# Patient Record
Sex: Female | Born: 1970 | Race: White | Hispanic: No | Marital: Married | Smoking: Current every day smoker
Health system: Southern US, Community
[De-identification: ages and names within clinical notes are randomized; demographics above are authoritative.]

## PROBLEM LIST (undated history)

## (undated) DIAGNOSIS — R569 Unspecified convulsions: Secondary | ICD-10-CM

## (undated) DIAGNOSIS — G8929 Other chronic pain: Secondary | ICD-10-CM

## (undated) DIAGNOSIS — J449 Chronic obstructive pulmonary disease, unspecified: Secondary | ICD-10-CM

## (undated) DIAGNOSIS — J45909 Unspecified asthma, uncomplicated: Secondary | ICD-10-CM

## (undated) HISTORY — PX: CHOLECYSTECTOMY: SHX55

---

## 2019-11-23 ENCOUNTER — Emergency Department (HOSPITAL_COMMUNITY): Payer: Medicare Other

## 2019-11-23 ENCOUNTER — Emergency Department (HOSPITAL_COMMUNITY)
Admission: EM | Admit: 2019-11-23 | Discharge: 2019-11-23 | Disposition: A | Discharge status: Home / Self Care | Payer: Medicare Other | Attending: Emergency Medicine | Admitting: Emergency Medicine

## 2019-11-23 ENCOUNTER — Other Ambulatory Visit: Payer: Self-pay

## 2019-11-23 ENCOUNTER — Encounter (HOSPITAL_COMMUNITY): Payer: Self-pay | Admitting: *Deleted

## 2019-11-23 DIAGNOSIS — J441 Chronic obstructive pulmonary disease with (acute) exacerbation: Secondary | ICD-10-CM

## 2019-11-23 DIAGNOSIS — F17210 Nicotine dependence, cigarettes, uncomplicated: Secondary | ICD-10-CM | POA: Insufficient documentation

## 2019-11-23 DIAGNOSIS — R400 Somnolence: Secondary | ICD-10-CM

## 2019-11-23 DIAGNOSIS — Z9049 Acquired absence of other specified parts of digestive tract: Secondary | ICD-10-CM | POA: Diagnosis not present

## 2019-11-23 DIAGNOSIS — R05 Cough: Secondary | ICD-10-CM | POA: Diagnosis not present

## 2019-11-23 DIAGNOSIS — R042 Hemoptysis: Secondary | ICD-10-CM | POA: Insufficient documentation

## 2019-11-23 DIAGNOSIS — J45909 Unspecified asthma, uncomplicated: Secondary | ICD-10-CM | POA: Diagnosis not present

## 2019-11-23 DIAGNOSIS — R42 Dizziness and giddiness: Secondary | ICD-10-CM | POA: Diagnosis present

## 2019-11-23 DIAGNOSIS — Z20822 Contact with and (suspected) exposure to covid-19: Secondary | ICD-10-CM | POA: Diagnosis not present

## 2019-11-23 DIAGNOSIS — R0602 Shortness of breath: Secondary | ICD-10-CM | POA: Diagnosis not present

## 2019-11-23 HISTORY — DX: Unspecified asthma, uncomplicated: J45.909

## 2019-11-23 HISTORY — DX: Chronic obstructive pulmonary disease, unspecified: J44.9

## 2019-11-23 HISTORY — DX: Unspecified convulsions: R56.9

## 2019-11-23 HISTORY — DX: Other chronic pain: G89.29

## 2019-11-23 LAB — CBC WITH DIFFERENTIAL/PLATELET
Abs Immature Granulocytes: 0.03 10*3/uL (ref 0.00–0.07)
Basophils Absolute: 0.1 10*3/uL (ref 0.0–0.1)
Basophils Relative: 1 %
Eosinophils Absolute: 0.7 10*3/uL — ABNORMAL HIGH (ref 0.0–0.5)
Eosinophils Relative: 9 %
HCT: 39 % (ref 36.0–46.0)
Hemoglobin: 13 g/dL (ref 12.0–15.0)
Immature Granulocytes: 0 %
Lymphocytes Relative: 26 %
Lymphs Abs: 2.1 10*3/uL (ref 0.7–4.0)
MCH: 31 pg (ref 26.0–34.0)
MCHC: 33.3 g/dL (ref 30.0–36.0)
MCV: 93.1 fL (ref 80.0–100.0)
Monocytes Absolute: 0.5 10*3/uL (ref 0.1–1.0)
Monocytes Relative: 6 %
Neutro Abs: 4.8 10*3/uL (ref 1.7–7.7)
Neutrophils Relative %: 58 %
Platelets: 249 10*3/uL (ref 150–400)
RBC: 4.19 MIL/uL (ref 3.87–5.11)
RDW: 13.3 % (ref 11.5–15.5)
WBC: 8.3 10*3/uL (ref 4.0–10.5)
nRBC: 0 % (ref 0.0–0.2)

## 2019-11-23 LAB — COMPREHENSIVE METABOLIC PANEL
ALT: 27 U/L (ref 0–44)
AST: 36 U/L (ref 15–41)
Albumin: 3.8 g/dL (ref 3.5–5.0)
Alkaline Phosphatase: 99 U/L (ref 38–126)
Anion gap: 6 (ref 5–15)
BUN: 7 mg/dL (ref 6–20)
CO2: 31 mmol/L (ref 22–32)
Calcium: 8.8 mg/dL — ABNORMAL LOW (ref 8.9–10.3)
Chloride: 102 mmol/L (ref 98–111)
Creatinine, Ser: 0.46 mg/dL (ref 0.44–1.00)
GFR calc Af Amer: >60 mL/min (ref >60)
GFR calc non Af Amer: >60 mL/min (ref >60)
Glucose, Bld: 100 mg/dL — ABNORMAL HIGH (ref 70–99)
Potassium: 3.8 mmol/L (ref 3.5–5.1)
Sodium: 139 mmol/L (ref 135–145)
Total Bilirubin: 0.3 mg/dL (ref 0.3–1.2)
Total Protein: 7.2 g/dL (ref 6.5–8.1)

## 2019-11-23 LAB — URINALYSIS, ROUTINE W REFLEX MICROSCOPIC
Bacteria, UA: NONE SEEN
Bilirubin Urine: NEGATIVE
Glucose, UA: NEGATIVE mg/dL
Hgb urine dipstick: NEGATIVE
Ketones, ur: NEGATIVE mg/dL
Nitrite: NEGATIVE
Protein, ur: NEGATIVE mg/dL
Specific Gravity, Urine: 1.018 (ref 1.005–1.030)
pH: 6 (ref 5.0–8.0)

## 2019-11-23 LAB — BLOOD GAS, ARTERIAL
Acid-Base Excess: 5.7 mmol/L — ABNORMAL HIGH (ref 0.0–2.0)
Bicarbonate: 28.7 mmol/L — ABNORMAL HIGH (ref 20.0–28.0)
Drawn by: 22223
FIO2: 21
O2 Saturation: 94.8 %
Patient temperature: 37
pCO2 arterial: 53.1 mmHg — ABNORMAL HIGH (ref 32.0–48.0)
pH, Arterial: 7.38 (ref 7.350–7.450)
pO2, Arterial: 75.1 mmHg — ABNORMAL LOW (ref 83.0–108.0)

## 2019-11-23 LAB — RESPIRATORY PANEL BY RT PCR (FLU A&B, COVID)
Influenza A by PCR: NEGATIVE
Influenza B by PCR: NEGATIVE
SARS Coronavirus 2 by RT PCR: NEGATIVE

## 2019-11-23 LAB — RAPID URINE DRUG SCREEN, HOSP PERFORMED
Amphetamines: NOT DETECTED
Barbiturates: NOT DETECTED
Benzodiazepines: POSITIVE — AB
Cocaine: NOT DETECTED
Opiates: NOT DETECTED
Tetrahydrocannabinol: NOT DETECTED

## 2019-11-23 LAB — POC SARS CORONAVIRUS 2 AG -  ED: SARS Coronavirus 2 Ag: NEGATIVE

## 2019-11-23 LAB — ETHANOL: Alcohol, Ethyl (B): <10 mg/dL (ref <10)

## 2019-11-23 MED ORDER — METHYLPREDNISOLONE SODIUM SUCC 125 MG IJ SOLR
125.0000 mg | Freq: Once | INTRAMUSCULAR | Status: AC
  Administered 2019-11-23: 125 mg via INTRAVENOUS
  Filled 2019-11-23: qty 2

## 2019-11-23 MED ORDER — IPRATROPIUM-ALBUTEROL 0.5-2.5 (3) MG/3ML IN SOLN
3.0000 mL | Freq: Once | RESPIRATORY_TRACT | Status: AC
  Administered 2019-11-23: 3 mL via RESPIRATORY_TRACT
  Filled 2019-11-23: qty 3

## 2019-11-23 MED ORDER — DOXYCYCLINE HYCLATE 100 MG PO CAPS: 100.0000 mg | ORAL_CAPSULE | Freq: Two times a day (BID) | ORAL | 0 refills | Status: AC

## 2019-11-23 MED ORDER — IPRATROPIUM BROMIDE 0.02 % IN SOLN: 0.5000 mg | Freq: Once | RESPIRATORY_TRACT | Status: DC

## 2019-11-23 MED ORDER — ALBUTEROL SULFATE HFA 108 (90 BASE) MCG/ACT IN AERS
4.0000 | INHALATION_SPRAY | Freq: Once | RESPIRATORY_TRACT | Status: AC
  Administered 2019-11-23: 20:00:00 4 via RESPIRATORY_TRACT
  Filled 2019-11-23: qty 6.7

## 2019-11-23 MED ORDER — ALBUTEROL SULFATE (2.5 MG/3ML) 0.083% IN NEBU
2.5000 mg | INHALATION_SOLUTION | Freq: Once | RESPIRATORY_TRACT | Status: AC
  Administered 2019-11-23: 23:00:00 2.5 mg via RESPIRATORY_TRACT
  Filled 2019-11-23: qty 3

## 2019-11-23 MED ORDER — ALBUTEROL SULFATE (2.5 MG/3ML) 0.083% IN NEBU: 5.0000 mg | INHALATION_SOLUTION | Freq: Once | RESPIRATORY_TRACT | Status: DC

## 2019-11-23 MED ORDER — NALOXONE HCL 0.4 MG/ML IJ SOLN
0.4000 mg | Freq: Once | INTRAMUSCULAR | Status: AC
  Administered 2019-11-23: 0.4 mg via INTRAVENOUS
  Filled 2019-11-23: qty 1

## 2019-11-23 MED ORDER — ALBUTEROL SULFATE HFA 108 (90 BASE) MCG/ACT IN AERS: 2.0000 | INHALATION_SPRAY | RESPIRATORY_TRACT | 0 refills | Status: AC | PRN

## 2019-11-23 MED ORDER — PREDNISONE 10 MG (21) PO TBPK: ORAL_TABLET | ORAL | 0 refills | Status: AC

## 2019-11-23 NOTE — ED Triage Notes (Signed)
Pt c/o dizziness since yesterday after coughing up some blood, states she had a blood clot in her mouth.  When called for triage, pt was starting to nod off to sleep. Pt denies taking anything, states she is tired from not getting enough sleep.

## 2019-11-23 NOTE — ED Notes (Signed)
Placed pt.on 2L of O2 nasal cannula. When sleeping Pt. Drops down to the 80's and when woken up will come back up to the 90's.

## 2019-11-23 NOTE — ED Provider Notes (Signed)
[facility] EMERGENCY DEPARTMENT Provider Note  CSN: [id number] Arrival date & time: 11/23/19 1829    History Chief Complaint  Patient presents with  . Dizziness    HPI  Tina Montoya is a 49 y.o. female presents for evaluation of dizziness and hemoptysis.  Patient reports history of COPD although she has not been to the doctor recently does not take any medications on a regular basis.  She reports that she has had a worsening cough and shortness of breath for the last several days.  She denies any fever or chest pain but reports she noticed some blood in her sputum yesterday.  Today she feels some mild dizziness but no falls.  She was noted to be somnolent by nursing but denies any alcohol or drug use.  She does admit to significant tobacco use, at least 2 packs/day.  She has previously had hospital visits for pneumonia although none in this area.  She also previously had an inhaler but has since run out.   Past Medical History:  Diagnosis Date  . Asthma   . Chronic back pain   . COPD (chronic obstructive pulmonary disease) (HCC)   . Seizures (HCC)     Past Surgical History:  Procedure Laterality Date  . CHOLECYSTECTOMY      History reviewed. No pertinent family history.  Social History   Tobacco Use  . Smoking status: Current Every Day Smoker    Types: Cigarettes  . Smokeless tobacco: Never Used  Substance Use Topics  . Alcohol use: Not Currently  . Drug use: Not Currently     Home Medications Prior to Admission medications   Medication Sig Start Date End Date Taking? Authorizing Provider  albuterol (VENTOLIN HFA) 108 (90 Base) MCG/ACT inhaler Inhale 2 puffs into the lungs every 4 (four) hours as needed for wheezing or shortness of breath. 11/23/19   Pollyann Savoy, MD  doxycycline (VIBRAMYCIN) 100 MG capsule Take 1 capsule (100 mg total) by mouth 2 (two) times daily. 11/23/19   Pollyann Savoy, MD  predniSONE (STERAPRED UNI-PAK 21 TAB) 10 MG (21) TBPK  tablet Use as directed, 10mg  tablets, 6 day taper 11/23/19   01/23/20, MD     Allergies    Penicillins   Review of Systems   Review of Systems  Constitutional: Negative for fever.  HENT: Negative for congestion and sore throat.   Respiratory: Positive for cough and shortness of breath.   Cardiovascular: Positive for leg swelling. Negative for chest pain.  Gastrointestinal: Negative for abdominal pain, diarrhea, nausea and vomiting.  Genitourinary: Negative for dysuria.  Musculoskeletal: Negative for myalgias.  Skin: Negative for rash.  Neurological: Positive for dizziness. Negative for headaches.  Psychiatric/Behavioral: Negative for behavioral problems.     Physical Exam Pulse 93   Resp (!) 24   Ht 5\' 4"  (1.626 m)   Wt 85.3 kg   SpO2 96%   BMI 32.27 kg/m   Physical Exam Constitutional:      Appearance: Normal appearance.     Comments: Sleepy but arouses to verbal stimuli and is able to stay awake during HPI  HENT:     Head: Normocephalic and atraumatic.     Nose: Nose normal.     Mouth/Throat:     Mouth: Mucous membranes are moist.  Eyes:     Extraocular Movements: Extraocular movements intact.     Conjunctiva/sclera: Conjunctivae normal.  Cardiovascular:     Rate and Rhythm: Normal rate.  Pulmonary:  Comments: Diminished breath sounds, decreased air movement and wheezing in all lung fields Abdominal:     General: Abdomen is flat.     Palpations: Abdomen is soft.     Tenderness: There is no abdominal tenderness.  Musculoskeletal:        General: Swelling (Trace bilateral lower extremity edema, symmetric) present. Normal range of motion.     Cervical back: Neck supple.  Skin:    General: Skin is warm and dry.  Neurological:     General: No focal deficit present.  Psychiatric:        Mood and Affect: Mood normal.      ED Results / Procedures / Treatments   Labs (all labs ordered are listed, but only abnormal results are displayed) Labs  Reviewed  COMPREHENSIVE METABOLIC PANEL - Abnormal; Notable for the following components:      Result Value   Glucose, Bld 100 (*)    Calcium 8.8 (*)    All other components within normal limits  CBC WITH DIFFERENTIAL/PLATELET - Abnormal; Notable for the following components:   Eosinophils Absolute 0.7 (*)    All other components within normal limits  URINALYSIS, ROUTINE W REFLEX MICROSCOPIC - Abnormal; Notable for the following components:   Leukocytes,Ua TRACE (*)    All other components within normal limits  RAPID URINE DRUG SCREEN, HOSP PERFORMED - Abnormal; Notable for the following components:   Benzodiazepines POSITIVE (*)    All other components within normal limits  BLOOD GAS, ARTERIAL - Abnormal; Notable for the following components:   pCO2 arterial 53.1 (*)    pO2, Arterial 75.1 (*)    Bicarbonate 28.7 (*)    Acid-Base Excess 5.7 (*)    All other components within normal limits  RESPIRATORY PANEL BY RT PCR (FLU A&B, COVID)  ETHANOL  POC SARS CORONAVIRUS 2 AG -  ED    EKG EKG Interpretation  Date/Time:  Thursday November 23 2019 18:46:08 EDT Ventricular Rate:  97 PR Interval:    QRS Duration: 83 QT Interval:  352 QTC Calculation: 448 R Axis:   51 Text Interpretation: Sinus rhythm Normal ECG No old tracing to compare Confirmed by [facility]  MD, Tyasia Packard [id number]) on 11/23/2019 6:59:52 PM   Radiology DG Chest Portable 1 View  Result Date: 11/23/2019 CLINICAL DATA:  Cough. EXAM: PORTABLE CHEST 1 VIEW COMPARISON:  None. FINDINGS: There are hazy lung markings in the left lung field without evidence for large focal infiltrate. There is no pleural effusion. The heart size is normal. There is no pneumothorax. There is no acute osseous abnormality. IMPRESSION: No definite acute cardiopulmonary process. There are few subtle linear opacities in the left mid lung zone which may represent areas of atelectasis or scarring. Electronically Signed   By: Katherine Mantle M.D.   On:  11/23/2019 19:41    Procedures Procedures  Medications Ordered in the ED Medications  albuterol (VENTOLIN HFA) 108 (90 Base) MCG/ACT inhaler 4 puff (4 puffs Inhalation Given 11/23/19 1946)  methylPREDNISolone sodium succinate (SOLU-MEDROL) 125 mg/2 mL injection 125 mg (125 mg Intravenous Given 11/23/19 1947)  naloxone [facility]) injection 0.4 mg (0.4 mg Intravenous Given 11/23/19 2109)  ipratropium-albuterol (DUONEB) 0.5-2.5 (3) MG/3ML nebulizer solution 3 mL (3 mLs Nebulization Given 11/23/19 2251)  albuterol (PROVENTIL) (2.5 MG/3ML) 0.083% nebulizer solution 2.5 mg (2.5 mg Nebulization Given 11/23/19 2251)     ED Course  I have reviewed the triage vital signs and the nursing notes.  Pertinent labs & imaging results that were available  during my care of the patient were reviewed by me and considered in my medical decision making (see chart for details).  Clinical Course as of Nov 23 2319  Thu Nov 23, 2019  7001 Patient with reported history of COPD, not taking any medications.  Noted to be sleeping with oxygen sats in the upper 80s/low 90s.  Nursing was asked to place her on 2 L nasal cannula, goal is SPO2 of 92%.   [CS]  1947 ABG does not show a significant hypercapnia or respiratory acidosis.    [CS]  2111 Patient continues to have somnolence with brief periods of apnea and hypoxia. CO2 not significantly elevated, SpO2 improves with waking. Will give a small dose of Narcan to see if that helps.    [CS]  2129 Patient's Covid POC is negative. Will send a 2-hour TAT test to help clear for nebulizer treatments.    [CS]  2228 Covid PRC is neg. Albuterol neb ordered. Patient is reluctant to stay in the hospital. CAnnot explain why she is so sleepy except to say she hasn't been sleeping well at home recently.    [CS]  2316 Patient feeling better after neb treatment. Her UDS shows benzos but she is adamant that she has not been taking anything. This is likely the cause of her somnolence. I discussed  admission for further treatment of her likely COPD exacerbation but she adamant that she wants to go home. She will be given Rx for steroids, inhaler and advised to return to the ED if her symptoms worsen. Encouraged to stop smoking.    [CS]    Clinical Course User Index [CS] Truddie Hidden, MD    MDM Rules/Calculators/A&P MDM Number of Diagnoses or Management Options Diagnosis management comments: Patient with shortness of breath and reported hemoptysis, sputum in the ED is thick white/yellow.  She is hypersomnolent but awakens easily.  She denies any alcohol or drug use.  Differential diagnosis includes illicit substance use, hypercapnia, pneumonia, hypoxia.  Labs and chest x-ray were ordered to evaluate the above conditions.    Amount and/or Complexity of Data Reviewed Clinical lab tests: ordered and reviewed Tests in the radiology section of CPT: ordered and reviewed Review and summarize past medical records: yes Independent visualization of images, tracings, or specimens: yes  Risk of Complications, Morbidity, and/or Mortality Presenting problems: high Diagnostic procedures: high Management options: high    Final Clinical Impression(s) / ED Diagnoses Final diagnoses:  COPD with acute exacerbation ([place])  Hemoptysis  Somnolence    Rx / DC Orders ED Discharge Orders         Ordered    albuterol (VENTOLIN HFA) 108 (90 Base) MCG/ACT inhaler  Every 4 hours PRN     11/23/19 2321    predniSONE (STERAPRED UNI-PAK 21 TAB) 10 MG (21) TBPK tablet     11/23/19 2321    doxycycline (VIBRAMYCIN) 100 MG capsule  2 times daily     11/23/19 2321           Truddie Hidden, MD 11/23/19 2321

## 2019-11-27 ENCOUNTER — Ambulatory Visit
Admission: EM | Admit: 2019-11-27 | Discharge: 2019-11-27 | Disposition: A | Discharge status: Home / Self Care | Payer: Medicare Other | Attending: Family Medicine | Admitting: Family Medicine

## 2019-11-27 ENCOUNTER — Other Ambulatory Visit: Payer: Self-pay

## 2019-11-27 DIAGNOSIS — J449 Chronic obstructive pulmonary disease, unspecified: Secondary | ICD-10-CM | POA: Diagnosis not present

## 2019-11-27 DIAGNOSIS — R0602 Shortness of breath: Secondary | ICD-10-CM

## 2019-11-27 DIAGNOSIS — J441 Chronic obstructive pulmonary disease with (acute) exacerbation: Secondary | ICD-10-CM

## 2019-11-27 MED ORDER — BENZONATATE 100 MG PO CAPS: 100.0000 mg | ORAL_CAPSULE | Freq: Three times a day (TID) | ORAL | 0 refills | Status: AC | PRN

## 2019-11-27 MED ORDER — HYDROCODONE-HOMATROPINE 5-1.5 MG/5ML PO SYRP: 5.0000 mL | ORAL_SOLUTION | Freq: Every evening | ORAL | 0 refills | Status: AC | PRN

## 2019-11-27 MED ORDER — ALBUTEROL SULFATE (2.5 MG/3ML) 0.083% IN NEBU: 2.5000 mg | INHALATION_SOLUTION | Freq: Four times a day (QID) | RESPIRATORY_TRACT | 12 refills | Status: AC | PRN

## 2019-11-27 MED ORDER — IPRATROPIUM BROMIDE 0.02 % IN SOLN: 0.5000 mg | Freq: Four times a day (QID) | RESPIRATORY_TRACT | 12 refills | Status: DC

## 2019-11-27 MED ORDER — IPRATROPIUM BROMIDE 0.02 % IN SOLN: 0.5000 mg | Freq: Four times a day (QID) | RESPIRATORY_TRACT | 12 refills | Status: AC | PRN

## 2019-11-27 NOTE — ED Provider Notes (Signed)
[facility]    CSN: [id number] Arrival date & time: 11/27/19  1634      History   Chief Complaint No chief complaint on file.   HPI Tina Montoya is a 49 y.o. female.   HPI   Patient presents for further evaluation of cough and shortness of breath related to COPD exacerbation. Patient initially evaluated at the ER 11/23/19, however, is here today requesting a nebulizer treatment. Due to COVID-19 nebulizer treatments are not being administered system wide. She continues cough with productive discolored sputum. Sputum occasionally has blood mixed sputum. She endorses chest tightness. She has an albuterol inhaler which is not providing adequate relief. She is afebrile. Denies any recent exposures to COVID-19. Recent CXR negative for any acute changes. Past Medical History:  Diagnosis Date  . Asthma   . Chronic back pain   . COPD (chronic obstructive pulmonary disease) ([place])   . Seizures ([place])     There are no problems to display for this patient.   Past Surgical History:  Procedure Laterality Date  . CHOLECYSTECTOMY      OB History   No obstetric history on file.      Home Medications    Prior to Admission medications   Medication Sig Start Date End Date Taking? Authorizing Provider  albuterol (PROVENTIL) (2.5 MG/3ML) 0.083% nebulizer solution Take 3 mLs (2.5 mg total) by nebulization every 6 (six) hours as needed for wheezing or shortness of breath. 11/27/19   Scot Jun, FNP  albuterol (VENTOLIN HFA) 108 (90 Base) MCG/ACT inhaler Inhale 2 puffs into the lungs every 4 (four) hours as needed for wheezing or shortness of breath. 11/23/19   Truddie Hidden, MD  benzonatate (TESSALON) 100 MG capsule Take 1-2 capsules (100-200 mg total) by mouth 3 (three) times daily as needed for cough. 11/27/19   Scot Jun, FNP  doxycycline (VIBRAMYCIN) 100 MG capsule Take 1 capsule (100 mg total) by mouth 2 (two) times daily. 11/23/19   Truddie Hidden, MD    HYDROcodone-homatropine [facility]) 5-1.5 MG/5ML syrup Take 5 mLs by mouth at bedtime as needed and may repeat dose one time if needed for cough. 11/27/19   Scot Jun, FNP  ipratropium (ATROVENT) 0.02 % nebulizer solution Take 2.5 mLs (0.5 mg total) by nebulization every 6 (six) hours as needed for wheezing or shortness of breath. 11/27/19   Scot Jun, FNP  predniSONE (STERAPRED UNI-PAK 21 TAB) 10 MG (21) TBPK tablet Use as directed, 10mg  tablets, 6 day taper 11/23/19   Truddie Hidden, MD    Family History History reviewed. No pertinent family history.  Social History Social History   Tobacco Use  . Smoking status: Current Every Day Smoker    Types: Cigarettes  . Smokeless tobacco: Never Used  Substance Use Topics  . Alcohol use: Not Currently  . Drug use: Not Currently     Allergies   Penicillins   Review of Systems Review of Systems Pertinent negatives listed in HPI Physical Exam Triage Vital Signs ED Triage Vitals  Enc Vitals Group     BP 11/27/19 1641 (!) 163/90     Pulse Rate 11/27/19 1641 93     Resp 11/27/19 1641 (!) 22     Temp 11/27/19 1641 98.8 F (37.1 C)     Temp Source 11/27/19 1641 Oral     SpO2 11/27/19 1641 91 %     Weight --      Height --  Head Circumference --      Peak Flow --      Pain Score 11/27/19 1644 0     Pain Loc --      Pain Edu? --      Excl. in GC? --    No data found.  Updated Vital Signs BP (!) 163/90 (BP Location: Right Arm)   Pulse 93   Temp 98.8 F (37.1 C) (Oral)   Resp (!) 22   SpO2 91%   Visual Acuity Right Eye Distance:   Left Eye Distance:   Bilateral Distance:    Right Eye Near:   Left Eye Near:    Bilateral Near:     Physical Exam Constitutional:      Appearance: She is ill-appearing.  HENT:     Nose: Congestion present.     Mouth/Throat:     Pharynx: Posterior oropharyngeal erythema present. No oropharyngeal exudate.  Cardiovascular:     Rate and Rhythm: Normal rate and regular  rhythm.  Pulmonary:     Effort: No respiratory distress.     Breath sounds: No stridor. Rhonchi present. No wheezing or rales.  Musculoskeletal:     Cervical back: Normal range of motion.  Lymphadenopathy:     Cervical: No cervical adenopathy.  Skin:    General: Skin is warm.  Neurological:     General: No focal deficit present.     Mental Status: She is alert.  Psychiatric:        Mood and Affect: Mood normal.      UC Treatments / Results  Labs (all labs ordered are listed, but only abnormal results are displayed) Labs Reviewed - No data to display  EKG   Radiology No results found.  Procedures Procedures (including critical care time)  Medications Ordered in UC Medications - No data to display  Initial Impression / Assessment and Plan / UC Course  I have reviewed the triage vital signs and the nursing notes.  Pertinent labs & imaging results that were available during my care of the patient were reviewed by me and considered in my medical decision making (see chart for details).    COPD exacerbation (HCC) SOB (shortness of breath) -Continue Doxycyline and prednisone prescribed during ER visit. -Start nebulizer treatments with albuterol and Atrovent every 6 hours as needed for wheezing. -Start benzonatate 100-200 mg 3 times daily as needed for cough. -Hycodan 5 ml at bedtime only as needed for cough.  Final Clinical Impressions(s) / UC Diagnosis    Final diagnoses:  COPD exacerbation (HCC)  SOB (shortness of breath)   Discharge Instructions   None    ED Prescriptions    Medication Sig Dispense Auth. Provider   albuterol (PROVENTIL) (2.5 MG/3ML) 0.083% nebulizer solution Take 3 mLs (2.5 mg total) by nebulization every 6 (six) hours as needed for wheezing or shortness of breath. 75 mL Bing Neighbors, FNP   ipratropium (ATROVENT) 0.02 % nebulizer solution  (Status: Discontinued) Take 2.5 mLs (0.5 mg total) by nebulization 4 (four) times daily. 75 mL  Bing Neighbors, FNP   HYDROcodone-homatropine [facility]) 5-1.5 MG/5ML syrup Take 5 mLs by mouth at bedtime as needed and may repeat dose one time if needed for cough. 100 mL Bing Neighbors, FNP   benzonatate (TESSALON) 100 MG capsule Take 1-2 capsules (100-200 mg total) by mouth 3 (three) times daily as needed for cough. 60 capsule Bing Neighbors, FNP   ipratropium (ATROVENT) 0.02 % nebulizer solution Take 2.5 mLs (0.5  mg total) by nebulization every 6 (six) hours as needed for wheezing or shortness of breath. 75 mL Bing Neighbors, FNP     I have reviewed the PDMP during this encounter.   Bing Neighbors, FNP 11/29/19 1353

## 2019-11-27 NOTE — ED Triage Notes (Signed)
Pt presents with c/o cough and blood in sputum was seen in ED on the 8th

## 2019-12-27 DIAGNOSIS — J449 Chronic obstructive pulmonary disease, unspecified: Secondary | ICD-10-CM | POA: Diagnosis not present

## 2020-01-11 DIAGNOSIS — M545 Low back pain: Secondary | ICD-10-CM | POA: Diagnosis not present

## 2020-01-11 DIAGNOSIS — E559 Vitamin D deficiency, unspecified: Secondary | ICD-10-CM | POA: Diagnosis not present

## 2020-01-11 DIAGNOSIS — E785 Hyperlipidemia, unspecified: Secondary | ICD-10-CM | POA: Diagnosis not present

## 2020-01-11 DIAGNOSIS — R030 Elevated blood-pressure reading, without diagnosis of hypertension: Secondary | ICD-10-CM | POA: Diagnosis not present

## 2020-01-11 DIAGNOSIS — J449 Chronic obstructive pulmonary disease, unspecified: Secondary | ICD-10-CM | POA: Diagnosis not present

## 2020-01-24 DIAGNOSIS — M545 Low back pain: Secondary | ICD-10-CM | POA: Diagnosis not present

## 2020-01-24 DIAGNOSIS — M79605 Pain in left leg: Secondary | ICD-10-CM | POA: Diagnosis not present

## 2020-01-27 DIAGNOSIS — J449 Chronic obstructive pulmonary disease, unspecified: Secondary | ICD-10-CM | POA: Diagnosis not present

## 2020-02-06 DIAGNOSIS — F17210 Nicotine dependence, cigarettes, uncomplicated: Secondary | ICD-10-CM | POA: Diagnosis not present

## 2020-02-14 ENCOUNTER — Encounter: Payer: Self-pay | Admitting: Orthopaedic Surgery

## 2020-02-15 DIAGNOSIS — R042 Hemoptysis: Secondary | ICD-10-CM | POA: Diagnosis not present

## 2020-02-15 DIAGNOSIS — J441 Chronic obstructive pulmonary disease with (acute) exacerbation: Secondary | ICD-10-CM | POA: Diagnosis not present

## 2020-02-15 DIAGNOSIS — R918 Other nonspecific abnormal finding of lung field: Secondary | ICD-10-CM | POA: Diagnosis not present

## 2020-02-22 DIAGNOSIS — M545 Low back pain: Secondary | ICD-10-CM | POA: Diagnosis not present

## 2020-02-22 DIAGNOSIS — E559 Vitamin D deficiency, unspecified: Secondary | ICD-10-CM | POA: Diagnosis not present

## 2020-02-22 DIAGNOSIS — F17210 Nicotine dependence, cigarettes, uncomplicated: Secondary | ICD-10-CM | POA: Diagnosis not present

## 2020-02-22 DIAGNOSIS — G8929 Other chronic pain: Secondary | ICD-10-CM | POA: Diagnosis not present

## 2020-02-22 DIAGNOSIS — Z79899 Other long term (current) drug therapy: Secondary | ICD-10-CM | POA: Diagnosis not present

## 2020-02-22 DIAGNOSIS — R739 Hyperglycemia, unspecified: Secondary | ICD-10-CM | POA: Diagnosis not present

## 2020-02-26 DIAGNOSIS — J449 Chronic obstructive pulmonary disease, unspecified: Secondary | ICD-10-CM | POA: Diagnosis not present

## 2020-03-08 DIAGNOSIS — R300 Dysuria: Secondary | ICD-10-CM | POA: Diagnosis not present

## 2020-03-08 DIAGNOSIS — R319 Hematuria, unspecified: Secondary | ICD-10-CM | POA: Diagnosis not present

## 2020-03-08 DIAGNOSIS — N390 Urinary tract infection, site not specified: Secondary | ICD-10-CM | POA: Diagnosis not present

## 2020-03-09 DIAGNOSIS — E559 Vitamin D deficiency, unspecified: Secondary | ICD-10-CM | POA: Diagnosis not present

## 2020-03-09 DIAGNOSIS — M545 Low back pain: Secondary | ICD-10-CM | POA: Diagnosis not present

## 2020-03-09 DIAGNOSIS — G8929 Other chronic pain: Secondary | ICD-10-CM | POA: Diagnosis not present

## 2020-03-09 DIAGNOSIS — Z79899 Other long term (current) drug therapy: Secondary | ICD-10-CM | POA: Diagnosis not present

## 2020-03-12 DIAGNOSIS — R918 Other nonspecific abnormal finding of lung field: Secondary | ICD-10-CM | POA: Diagnosis not present

## 2020-03-12 DIAGNOSIS — J439 Emphysema, unspecified: Secondary | ICD-10-CM | POA: Diagnosis not present

## 2020-03-12 DIAGNOSIS — R9389 Abnormal findings on diagnostic imaging of other specified body structures: Secondary | ICD-10-CM | POA: Diagnosis not present

## 2020-03-15 DIAGNOSIS — J430 Unilateral pulmonary emphysema [MacLeod's syndrome]: Secondary | ICD-10-CM | POA: Diagnosis not present

## 2020-03-15 DIAGNOSIS — Z0000 Encounter for general adult medical examination without abnormal findings: Secondary | ICD-10-CM | POA: Diagnosis not present

## 2020-03-15 DIAGNOSIS — F17218 Nicotine dependence, cigarettes, with other nicotine-induced disorders: Secondary | ICD-10-CM | POA: Diagnosis not present

## 2020-03-22 DIAGNOSIS — S32050A Wedge compression fracture of fifth lumbar vertebra, initial encounter for closed fracture: Secondary | ICD-10-CM | POA: Diagnosis not present

## 2020-03-22 DIAGNOSIS — G8929 Other chronic pain: Secondary | ICD-10-CM | POA: Diagnosis not present

## 2020-03-22 DIAGNOSIS — Z79899 Other long term (current) drug therapy: Secondary | ICD-10-CM | POA: Diagnosis not present

## 2020-03-22 DIAGNOSIS — M545 Low back pain: Secondary | ICD-10-CM | POA: Diagnosis not present

## 2020-03-28 DIAGNOSIS — J449 Chronic obstructive pulmonary disease, unspecified: Secondary | ICD-10-CM | POA: Diagnosis not present

## 2020-04-17 DIAGNOSIS — M5442 Lumbago with sciatica, left side: Secondary | ICD-10-CM | POA: Diagnosis not present

## 2020-04-17 DIAGNOSIS — G8929 Other chronic pain: Secondary | ICD-10-CM | POA: Diagnosis not present

## 2020-04-17 DIAGNOSIS — J449 Chronic obstructive pulmonary disease, unspecified: Secondary | ICD-10-CM | POA: Diagnosis not present

## 2020-04-17 DIAGNOSIS — R05 Cough: Secondary | ICD-10-CM | POA: Diagnosis not present

## 2020-04-20 DIAGNOSIS — Z79899 Other long term (current) drug therapy: Secondary | ICD-10-CM | POA: Diagnosis not present

## 2020-04-20 DIAGNOSIS — M545 Low back pain: Secondary | ICD-10-CM | POA: Diagnosis not present

## 2020-04-20 DIAGNOSIS — G8929 Other chronic pain: Secondary | ICD-10-CM | POA: Diagnosis not present

## 2020-04-20 DIAGNOSIS — S32050A Wedge compression fracture of fifth lumbar vertebra, initial encounter for closed fracture: Secondary | ICD-10-CM | POA: Diagnosis not present

## 2020-04-28 DIAGNOSIS — J449 Chronic obstructive pulmonary disease, unspecified: Secondary | ICD-10-CM | POA: Diagnosis not present

## 2020-05-08 DIAGNOSIS — M5416 Radiculopathy, lumbar region: Secondary | ICD-10-CM | POA: Diagnosis not present

## 2020-05-08 DIAGNOSIS — M545 Low back pain: Secondary | ICD-10-CM | POA: Diagnosis not present

## 2020-05-08 DIAGNOSIS — M4306 Spondylolysis, lumbar region: Secondary | ICD-10-CM | POA: Diagnosis not present

## 2020-05-08 DIAGNOSIS — F17210 Nicotine dependence, cigarettes, uncomplicated: Secondary | ICD-10-CM | POA: Diagnosis not present

## 2020-05-08 DIAGNOSIS — M47816 Spondylosis without myelopathy or radiculopathy, lumbar region: Secondary | ICD-10-CM | POA: Diagnosis not present

## 2020-05-20 DIAGNOSIS — M545 Low back pain, unspecified: Secondary | ICD-10-CM | POA: Diagnosis not present

## 2020-05-20 DIAGNOSIS — Z79899 Other long term (current) drug therapy: Secondary | ICD-10-CM | POA: Diagnosis not present

## 2020-05-20 DIAGNOSIS — G8929 Other chronic pain: Secondary | ICD-10-CM | POA: Diagnosis not present

## 2020-05-24 DIAGNOSIS — R200 Anesthesia of skin: Secondary | ICD-10-CM | POA: Diagnosis not present

## 2020-05-24 DIAGNOSIS — E538 Deficiency of other specified B group vitamins: Secondary | ICD-10-CM | POA: Diagnosis not present

## 2020-05-24 DIAGNOSIS — Z1321 Encounter for screening for nutritional disorder: Secondary | ICD-10-CM | POA: Diagnosis not present

## 2020-05-24 DIAGNOSIS — R5383 Other fatigue: Secondary | ICD-10-CM | POA: Diagnosis not present

## 2020-05-24 DIAGNOSIS — R202 Paresthesia of skin: Secondary | ICD-10-CM | POA: Diagnosis not present

## 2020-05-24 DIAGNOSIS — Z5320 Procedure and treatment not carried out because of patient's decision for unspecified reasons: Secondary | ICD-10-CM | POA: Diagnosis not present

## 2020-05-28 DIAGNOSIS — J449 Chronic obstructive pulmonary disease, unspecified: Secondary | ICD-10-CM | POA: Diagnosis not present

## 2020-06-02 DIAGNOSIS — F32A Depression, unspecified: Secondary | ICD-10-CM | POA: Diagnosis not present

## 2020-06-18 DIAGNOSIS — R5383 Other fatigue: Secondary | ICD-10-CM | POA: Diagnosis not present

## 2020-06-18 DIAGNOSIS — J449 Chronic obstructive pulmonary disease, unspecified: Secondary | ICD-10-CM | POA: Diagnosis not present

## 2020-06-18 DIAGNOSIS — R3589 Other polyuria: Secondary | ICD-10-CM | POA: Diagnosis not present

## 2020-06-18 DIAGNOSIS — R631 Polydipsia: Secondary | ICD-10-CM | POA: Diagnosis not present

## 2020-06-18 DIAGNOSIS — R739 Hyperglycemia, unspecified: Secondary | ICD-10-CM | POA: Diagnosis not present

## 2020-06-20 DIAGNOSIS — G8929 Other chronic pain: Secondary | ICD-10-CM | POA: Diagnosis not present

## 2020-06-20 DIAGNOSIS — F17210 Nicotine dependence, cigarettes, uncomplicated: Secondary | ICD-10-CM | POA: Diagnosis not present

## 2020-06-20 DIAGNOSIS — M545 Low back pain, unspecified: Secondary | ICD-10-CM | POA: Diagnosis not present

## 2020-06-20 DIAGNOSIS — Z79899 Other long term (current) drug therapy: Secondary | ICD-10-CM | POA: Diagnosis not present

## 2020-06-28 DIAGNOSIS — J449 Chronic obstructive pulmonary disease, unspecified: Secondary | ICD-10-CM | POA: Diagnosis not present

## 2020-07-03 DIAGNOSIS — Z1231 Encounter for screening mammogram for malignant neoplasm of breast: Secondary | ICD-10-CM | POA: Diagnosis not present

## 2020-07-03 DIAGNOSIS — J449 Chronic obstructive pulmonary disease, unspecified: Secondary | ICD-10-CM | POA: Diagnosis not present

## 2020-07-10 DIAGNOSIS — M5416 Radiculopathy, lumbar region: Secondary | ICD-10-CM | POA: Diagnosis not present

## 2020-07-19 DIAGNOSIS — M545 Low back pain, unspecified: Secondary | ICD-10-CM | POA: Diagnosis not present

## 2020-07-19 DIAGNOSIS — Z79899 Other long term (current) drug therapy: Secondary | ICD-10-CM | POA: Diagnosis not present

## 2020-07-19 DIAGNOSIS — R892 Abnormal level of other drugs, medicaments and biological substances in specimens from other organs, systems and tissues: Secondary | ICD-10-CM | POA: Diagnosis not present

## 2020-07-19 DIAGNOSIS — Z9114 Patient's other noncompliance with medication regimen: Secondary | ICD-10-CM | POA: Diagnosis not present

## 2020-07-19 DIAGNOSIS — G8929 Other chronic pain: Secondary | ICD-10-CM | POA: Diagnosis not present

## 2020-07-28 DIAGNOSIS — J449 Chronic obstructive pulmonary disease, unspecified: Secondary | ICD-10-CM | POA: Diagnosis not present

## 2020-08-25 DIAGNOSIS — J439 Emphysema, unspecified: Secondary | ICD-10-CM | POA: Diagnosis not present

## 2020-08-25 DIAGNOSIS — Z5329 Procedure and treatment not carried out because of patient's decision for other reasons: Secondary | ICD-10-CM | POA: Diagnosis not present

## 2020-08-25 DIAGNOSIS — R062 Wheezing: Secondary | ICD-10-CM | POA: Diagnosis not present

## 2020-08-25 DIAGNOSIS — Z20822 Contact with and (suspected) exposure to covid-19: Secondary | ICD-10-CM | POA: Diagnosis not present

## 2020-08-25 DIAGNOSIS — R0602 Shortness of breath: Secondary | ICD-10-CM | POA: Diagnosis not present

## 2020-08-26 DIAGNOSIS — Z79891 Long term (current) use of opiate analgesic: Secondary | ICD-10-CM | POA: Diagnosis not present

## 2020-08-26 DIAGNOSIS — Z79899 Other long term (current) drug therapy: Secondary | ICD-10-CM | POA: Diagnosis not present

## 2020-08-28 DIAGNOSIS — J449 Chronic obstructive pulmonary disease, unspecified: Secondary | ICD-10-CM | POA: Diagnosis not present

## 2020-09-06 DIAGNOSIS — G8929 Other chronic pain: Secondary | ICD-10-CM | POA: Diagnosis not present

## 2020-09-06 DIAGNOSIS — J441 Chronic obstructive pulmonary disease with (acute) exacerbation: Secondary | ICD-10-CM | POA: Diagnosis not present

## 2020-09-06 DIAGNOSIS — J449 Chronic obstructive pulmonary disease, unspecified: Secondary | ICD-10-CM | POA: Diagnosis not present

## 2020-09-06 DIAGNOSIS — M5442 Lumbago with sciatica, left side: Secondary | ICD-10-CM | POA: Diagnosis not present

## 2020-09-06 DIAGNOSIS — R059 Cough, unspecified: Secondary | ICD-10-CM | POA: Diagnosis not present

## 2020-09-09 DIAGNOSIS — W010XXA Fall on same level from slipping, tripping and stumbling without subsequent striking against object, initial encounter: Secondary | ICD-10-CM | POA: Diagnosis not present

## 2020-09-09 DIAGNOSIS — E785 Hyperlipidemia, unspecified: Secondary | ICD-10-CM | POA: Diagnosis not present

## 2020-09-09 DIAGNOSIS — S32028A Other fracture of second lumbar vertebra, initial encounter for closed fracture: Secondary | ICD-10-CM | POA: Diagnosis not present

## 2020-09-09 DIAGNOSIS — T40496A Underdosing of other synthetic narcotics, initial encounter: Secondary | ICD-10-CM | POA: Diagnosis not present

## 2020-09-09 DIAGNOSIS — M5187 Other intervertebral disc disorders, lumbosacral region: Secondary | ICD-10-CM | POA: Diagnosis not present

## 2020-09-09 DIAGNOSIS — Z91048 Other nonmedicinal substance allergy status: Secondary | ICD-10-CM | POA: Diagnosis not present

## 2020-09-09 DIAGNOSIS — Z79899 Other long term (current) drug therapy: Secondary | ICD-10-CM | POA: Diagnosis not present

## 2020-09-09 DIAGNOSIS — F17210 Nicotine dependence, cigarettes, uncomplicated: Secondary | ICD-10-CM | POA: Diagnosis not present

## 2020-09-09 DIAGNOSIS — Z7951 Long term (current) use of inhaled steroids: Secondary | ICD-10-CM | POA: Diagnosis not present

## 2020-09-09 DIAGNOSIS — M5186 Other intervertebral disc disorders, lumbar region: Secondary | ICD-10-CM | POA: Diagnosis not present

## 2020-09-09 DIAGNOSIS — M5137 Other intervertebral disc degeneration, lumbosacral region: Secondary | ICD-10-CM | POA: Diagnosis not present

## 2020-09-09 DIAGNOSIS — M5127 Other intervertebral disc displacement, lumbosacral region: Secondary | ICD-10-CM | POA: Diagnosis not present

## 2020-09-09 DIAGNOSIS — S32020A Wedge compression fracture of second lumbar vertebra, initial encounter for closed fracture: Secondary | ICD-10-CM | POA: Diagnosis not present

## 2020-09-09 DIAGNOSIS — Z880 Allergy status to penicillin: Secondary | ICD-10-CM | POA: Diagnosis not present

## 2020-09-09 DIAGNOSIS — J449 Chronic obstructive pulmonary disease, unspecified: Secondary | ICD-10-CM | POA: Diagnosis not present

## 2020-09-12 DIAGNOSIS — Z79899 Other long term (current) drug therapy: Secondary | ICD-10-CM | POA: Diagnosis not present

## 2020-09-12 DIAGNOSIS — J449 Chronic obstructive pulmonary disease, unspecified: Secondary | ICD-10-CM | POA: Diagnosis not present

## 2020-09-12 DIAGNOSIS — Z5320 Procedure and treatment not carried out because of patient's decision for unspecified reasons: Secondary | ICD-10-CM | POA: Diagnosis not present

## 2020-09-12 DIAGNOSIS — Z13220 Encounter for screening for lipoid disorders: Secondary | ICD-10-CM | POA: Diagnosis not present

## 2020-09-12 DIAGNOSIS — Z0000 Encounter for general adult medical examination without abnormal findings: Secondary | ICD-10-CM | POA: Diagnosis not present

## 2020-09-12 DIAGNOSIS — Z1231 Encounter for screening mammogram for malignant neoplasm of breast: Secondary | ICD-10-CM | POA: Diagnosis not present

## 2020-09-12 DIAGNOSIS — Z1211 Encounter for screening for malignant neoplasm of colon: Secondary | ICD-10-CM | POA: Diagnosis not present

## 2020-09-16 DIAGNOSIS — Z5181 Encounter for therapeutic drug level monitoring: Secondary | ICD-10-CM | POA: Diagnosis not present

## 2020-09-16 DIAGNOSIS — S32020A Wedge compression fracture of second lumbar vertebra, initial encounter for closed fracture: Secondary | ICD-10-CM | POA: Diagnosis not present

## 2020-09-16 DIAGNOSIS — Z79899 Other long term (current) drug therapy: Secondary | ICD-10-CM | POA: Diagnosis not present

## 2021-10-07 IMAGING — DX DG CHEST 1V PORT
1 series · 1 of 1 positions shown · non-contrast
Comparison: None.

CLINICAL DATA: Cough.

EXAM:
PORTABLE CHEST 1 VIEW

[chest ap]
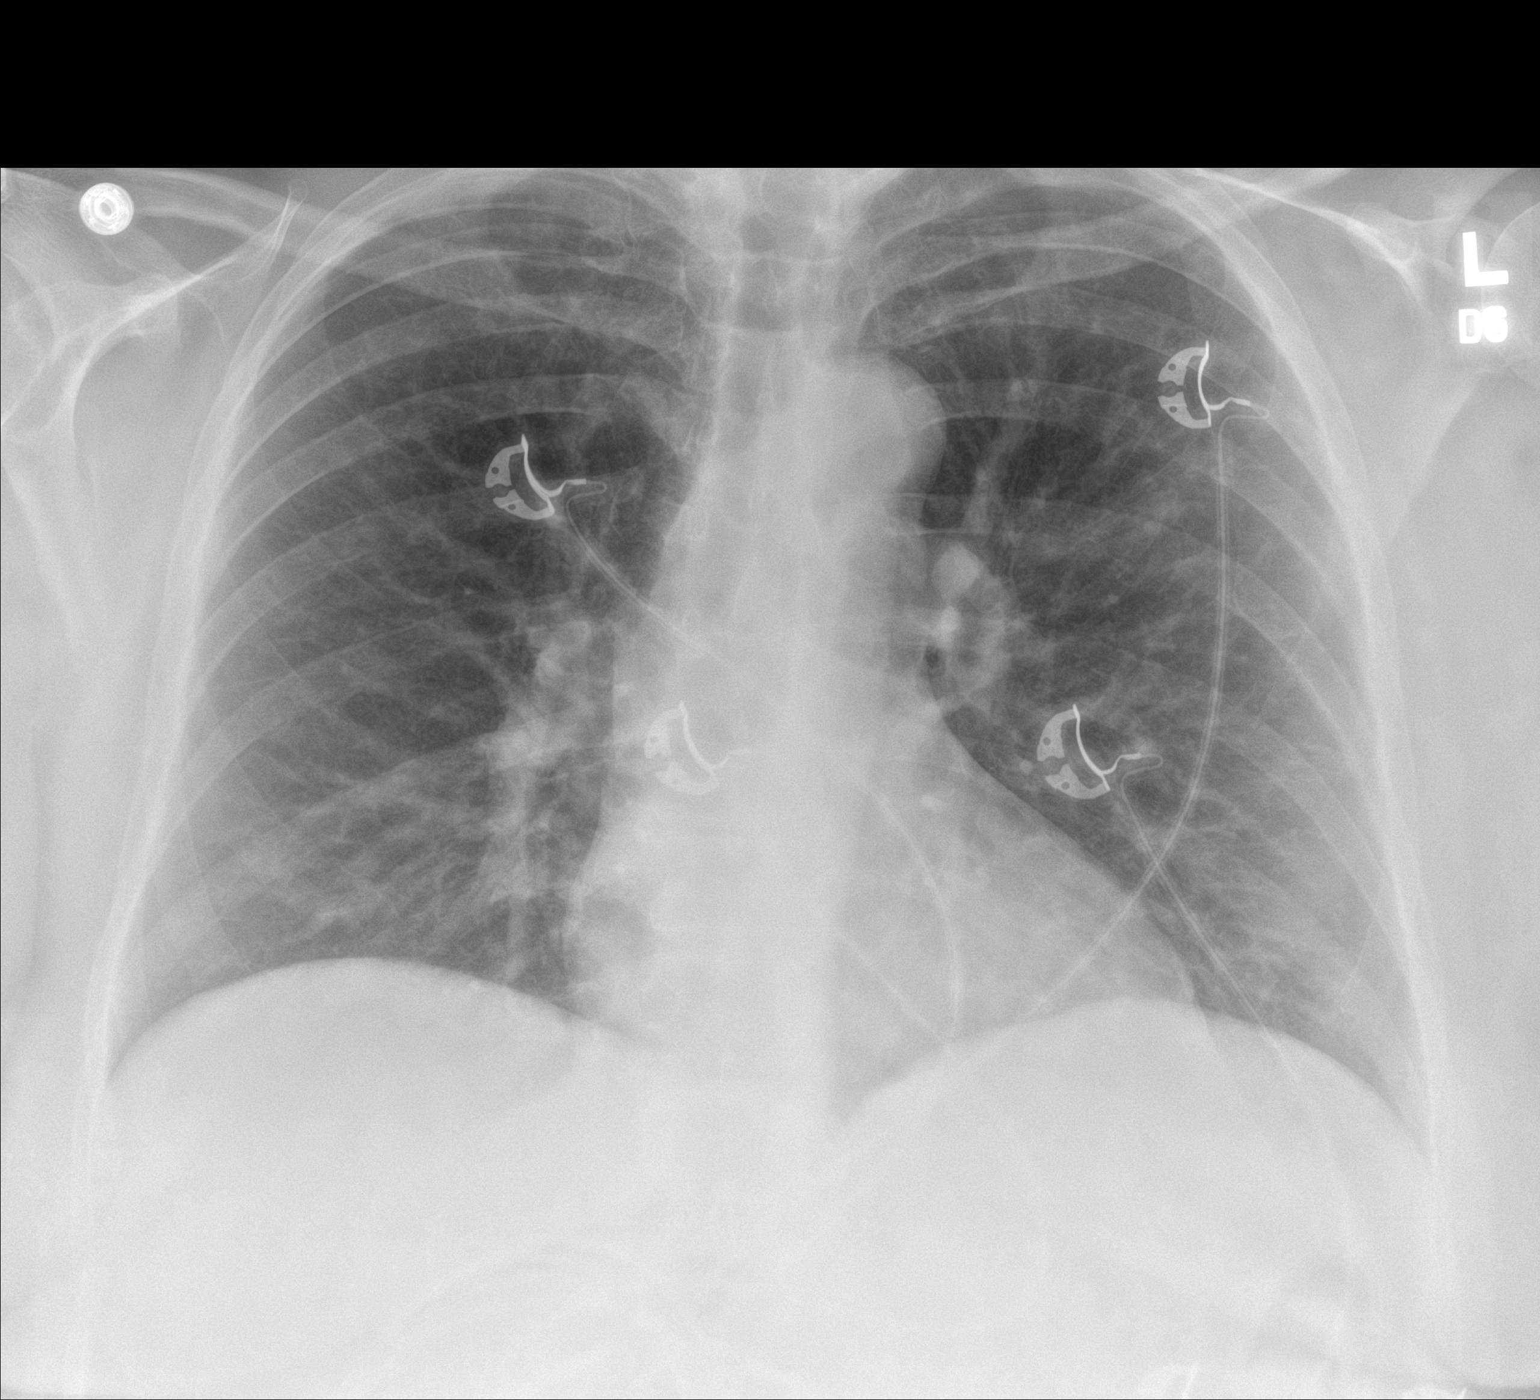

[1 of 1 positions shown; findings below may reference images not displayed]

FINDINGS: There are hazy lung markings in the left lung field without evidence
for large focal infiltrate. There is no pleural effusion. The heart
size is normal. There is no pneumothorax. There is no acute osseous
abnormality.
IMPRESSION: No definite acute cardiopulmonary process. There are few subtle
linear opacities in the left mid lung zone which may represent areas
of atelectasis or scarring.
